# Patient Record
Sex: Male | Born: 1940 | Race: Black or African American | Hispanic: No
Health system: Southern US, Community
[De-identification: ages and names within clinical notes are randomized; demographics above are authoritative.]

---

## 2004-05-19 ENCOUNTER — Ambulatory Visit: Payer: Self-pay | Admitting: Internal Medicine

## 2004-06-09 ENCOUNTER — Ambulatory Visit: Payer: Self-pay | Admitting: Internal Medicine

## 2004-07-02 ENCOUNTER — Ambulatory Visit: Payer: Self-pay | Admitting: Internal Medicine

## 2004-11-05 ENCOUNTER — Ambulatory Visit: Payer: Self-pay | Admitting: Internal Medicine

## 2005-05-03 ENCOUNTER — Ambulatory Visit: Payer: Self-pay | Admitting: Internal Medicine

## 2005-05-04 ENCOUNTER — Ambulatory Visit: Payer: Self-pay | Admitting: Internal Medicine

## 2005-05-12 ENCOUNTER — Ambulatory Visit: Payer: Self-pay | Admitting: Internal Medicine

## 2005-06-09 ENCOUNTER — Ambulatory Visit: Payer: Self-pay | Admitting: Internal Medicine

## 2005-11-24 ENCOUNTER — Ambulatory Visit: Payer: Self-pay | Admitting: Internal Medicine

## 2005-12-10 ENCOUNTER — Ambulatory Visit: Payer: Self-pay | Admitting: Internal Medicine

## 2005-12-20 ENCOUNTER — Ambulatory Visit: Payer: Self-pay | Admitting: Internal Medicine

## 2006-01-09 ENCOUNTER — Ambulatory Visit: Payer: Self-pay | Admitting: Internal Medicine

## 2006-02-09 ENCOUNTER — Ambulatory Visit: Payer: Self-pay | Admitting: Internal Medicine

## 2006-09-29 ENCOUNTER — Ambulatory Visit: Payer: Self-pay | Admitting: Urology

## 2007-07-31 ENCOUNTER — Ambulatory Visit: Payer: Self-pay | Admitting: Internal Medicine

## 2008-09-02 ENCOUNTER — Ambulatory Visit: Payer: Self-pay | Admitting: Vascular Surgery

## 2008-09-03 ENCOUNTER — Ambulatory Visit: Payer: Self-pay | Admitting: Vascular Surgery

## 2008-09-17 ENCOUNTER — Ambulatory Visit: Payer: Self-pay | Admitting: Vascular Surgery

## 2008-09-22 ENCOUNTER — Ambulatory Visit: Payer: Self-pay | Admitting: Vascular Surgery

## 2008-12-19 ENCOUNTER — Emergency Department: Payer: Self-pay | Admitting: Emergency Medicine

## 2008-12-19 ENCOUNTER — Ambulatory Visit: Payer: Self-pay | Admitting: Vascular Surgery

## 2009-03-11 ENCOUNTER — Ambulatory Visit: Payer: Self-pay | Admitting: Radiation Oncology

## 2009-03-31 ENCOUNTER — Ambulatory Visit: Payer: Self-pay | Admitting: Radiation Oncology

## 2009-04-11 ENCOUNTER — Ambulatory Visit: Payer: Self-pay | Admitting: Radiation Oncology

## 2010-09-11 ENCOUNTER — Inpatient Hospital Stay: Payer: Self-pay | Admitting: Internal Medicine

## 2010-09-11 ENCOUNTER — Other Ambulatory Visit: Payer: Self-pay | Admitting: Internal Medicine

## 2010-09-17 LAB — PATHOLOGY REPORT

## 2011-03-07 ENCOUNTER — Ambulatory Visit: Payer: Self-pay

## 2011-03-07 ENCOUNTER — Observation Stay: Payer: Self-pay | Admitting: Internal Medicine

## 2011-06-16 ENCOUNTER — Inpatient Hospital Stay: Payer: Self-pay | Admitting: Internal Medicine

## 2011-06-16 ENCOUNTER — Other Ambulatory Visit: Payer: Self-pay

## 2011-06-16 LAB — COMPREHENSIVE METABOLIC PANEL
Albumin: 3.2 g/dL — ABNORMAL LOW (ref 3.4–5.0)
Alkaline Phosphatase: 54 U/L (ref 50–136)
Anion Gap: 13 (ref 7–16)
Bilirubin,Total: 0.2 mg/dL (ref 0.2–1.0)
Co2: 31 mmol/L (ref 21–32)
Creatinine: 4.32 mg/dL — ABNORMAL HIGH (ref 0.60–1.30)
EGFR (African American): 18 — ABNORMAL LOW
EGFR (Non-African Amer.): 15 — ABNORMAL LOW
Glucose: 223 mg/dL — ABNORMAL HIGH (ref 65–99)
Osmolality: 294 (ref 275–301)
SGOT(AST): 17 U/L (ref 15–37)
SGPT (ALT): 26 U/L
Sodium: 143 mmol/L (ref 136–145)

## 2011-06-16 LAB — CBC
HCT: 21.4 % — ABNORMAL LOW (ref 40.0–52.0)
HGB: 7.1 g/dL — ABNORMAL LOW (ref 13.0–18.0)
MCH: 31.5 pg (ref 26.0–34.0)
MCHC: 32.9 g/dL (ref 32.0–36.0)
MCV: 96 fL (ref 80–100)
Platelet: 248 10*3/uL (ref 150–440)
RBC: 2.24 10*6/uL — ABNORMAL LOW (ref 4.40–5.90)
WBC: 9.6 10*3/uL (ref 3.8–10.6)

## 2011-06-16 LAB — IRON AND TIBC
Iron Bind.Cap.(Total): 271 ug/dL (ref 250–450)
Iron Saturation: 22 %
Iron: 60 ug/dL — ABNORMAL LOW (ref 65–175)

## 2011-06-16 LAB — HEMOGLOBIN: HGB: 6.9 g/dL — ABNORMAL LOW (ref 13.0–18.0)

## 2011-06-16 LAB — FERRITIN: Ferritin (ARMC): 796 ng/mL — ABNORMAL HIGH (ref 8–388)

## 2011-06-17 LAB — URINALYSIS, COMPLETE
Bacteria: NONE SEEN
Glucose,UR: >=500 mg/dL (ref 0–75)
Ketone: NEGATIVE
Leukocyte Esterase: NEGATIVE
Nitrite: NEGATIVE
Protein: 100
RBC,UR: 1 /HPF (ref 0–5)
Specific Gravity: 1.002 (ref 1.003–1.030)
Squamous Epithelial: 1
WBC UR: <1 /HPF (ref 0–5)

## 2011-06-17 LAB — CBC WITH DIFFERENTIAL/PLATELET
Basophil #: 0 10*3/uL (ref 0.0–0.1)
Basophil %: 0.1 %
Eosinophil #: 0.3 10*3/uL (ref 0.0–0.7)
HCT: 27.4 % — ABNORMAL LOW (ref 40.0–52.0)
HGB: 9 g/dL — ABNORMAL LOW (ref 13.0–18.0)
Lymphocyte %: 28.6 %
MCHC: 32.8 g/dL (ref 32.0–36.0)
Monocyte %: 10.6 %
Neutrophil #: 5.1 10*3/uL (ref 1.4–6.5)
Neutrophil %: 57.7 %
Platelet: 195 10*3/uL (ref 150–440)

## 2011-06-17 LAB — BASIC METABOLIC PANEL
Anion Gap: 12 (ref 7–16)
BUN: 26 mg/dL — ABNORMAL HIGH (ref 7–18)
Chloride: 101 mmol/L (ref 98–107)
Co2: 30 mmol/L (ref 21–32)
Creatinine: 6.07 mg/dL — ABNORMAL HIGH (ref 0.60–1.30)
EGFR (Non-African Amer.): 10 — ABNORMAL LOW
Glucose: 131 mg/dL — ABNORMAL HIGH (ref 65–99)
Potassium: 4 mmol/L (ref 3.5–5.1)
Sodium: 143 mmol/L (ref 136–145)

## 2011-06-17 LAB — HEMOGLOBIN A1C: Hemoglobin A1C: 7 % — ABNORMAL HIGH (ref 4.2–6.3)

## 2011-06-18 LAB — PHOSPHORUS: Phosphorus: 3.8 mg/dL (ref 2.5–4.9)

## 2011-06-22 ENCOUNTER — Ambulatory Visit: Payer: Self-pay | Admitting: Gastroenterology

## 2011-08-18 ENCOUNTER — Emergency Department: Payer: Self-pay | Admitting: Emergency Medicine

## 2013-01-02 ENCOUNTER — Encounter: Payer: Self-pay | Admitting: Neurology

## 2013-01-08 ENCOUNTER — Emergency Department: Payer: Self-pay | Admitting: Emergency Medicine

## 2013-01-08 LAB — COMPREHENSIVE METABOLIC PANEL
Albumin: 3.6 g/dL (ref 3.4–5.0)
Alkaline Phosphatase: 97 U/L (ref 50–136)
Anion Gap: 8 (ref 7–16)
BUN: 23 mg/dL — ABNORMAL HIGH (ref 7–18)
Bilirubin,Total: 0.3 mg/dL (ref 0.2–1.0)
Chloride: 100 mmol/L (ref 98–107)
EGFR (African American): 12 — ABNORMAL LOW
EGFR (Non-African Amer.): 11 — ABNORMAL LOW
Glucose: 146 mg/dL — ABNORMAL HIGH (ref 65–99)
Osmolality: 282 (ref 275–301)
SGOT(AST): 16 U/L (ref 15–37)
Sodium: 138 mmol/L (ref 136–145)

## 2013-01-08 LAB — CBC WITH DIFFERENTIAL/PLATELET
Basophil %: 0.5 %
Eosinophil %: 1.4 %
HCT: 32.3 % — ABNORMAL LOW (ref 40.0–52.0)
Lymphocyte #: 1 10*3/uL (ref 1.0–3.6)
Lymphocyte %: 13.3 %
MCH: 30.2 pg (ref 26.0–34.0)
Monocyte #: 0.4 x10 3/mm (ref 0.2–1.0)
Monocyte %: 5.4 %
Neutrophil %: 79.4 %
RBC: 3.52 10*6/uL — ABNORMAL LOW (ref 4.40–5.90)
WBC: 7.7 10*3/uL (ref 3.8–10.6)

## 2013-01-08 LAB — TROPONIN I: Troponin-I: <0.02 ng/mL

## 2013-01-09 ENCOUNTER — Encounter: Payer: Self-pay | Admitting: Neurology

## 2013-02-09 ENCOUNTER — Encounter: Payer: Self-pay | Admitting: Neurology

## 2013-02-13 ENCOUNTER — Ambulatory Visit: Payer: Self-pay | Admitting: Podiatry

## 2013-03-11 ENCOUNTER — Encounter: Payer: Self-pay | Admitting: Neurology

## 2013-06-08 ENCOUNTER — Inpatient Hospital Stay: Payer: Self-pay | Admitting: Internal Medicine

## 2013-06-08 LAB — CBC WITH DIFFERENTIAL/PLATELET
Basophil #: 0.1 10*3/uL (ref 0.0–0.1)
Basophil %: 0.8 %
Eosinophil #: 0.1 10*3/uL (ref 0.0–0.7)
Eosinophil %: 1.5 %
HCT: 22.3 % — AB (ref 40.0–52.0)
HGB: 7.2 g/dL — ABNORMAL LOW (ref 13.0–18.0)
Lymphocyte #: 0.9 10*3/uL — ABNORMAL LOW (ref 1.0–3.6)
Lymphocyte %: 13.2 %
MCH: 27.5 pg (ref 26.0–34.0)
MCHC: 32.4 g/dL (ref 32.0–36.0)
MCV: 85 fL (ref 80–100)
MONO ABS: 0.5 x10 3/mm (ref 0.2–1.0)
MONOS PCT: 7.8 %
NEUTROS ABS: 5.1 10*3/uL (ref 1.4–6.5)
Neutrophil %: 76.7 %
Platelet: 118 10*3/uL — ABNORMAL LOW (ref 150–440)
RBC: 2.64 10*6/uL — AB (ref 4.40–5.90)
RDW: 24.5 % — ABNORMAL HIGH (ref 11.5–14.5)
WBC: 6.6 10*3/uL (ref 3.8–10.6)

## 2013-06-08 LAB — BASIC METABOLIC PANEL
Anion Gap: 5 — ABNORMAL LOW (ref 7–16)
BUN: 17 mg/dL (ref 7–18)
CALCIUM: 8.7 mg/dL (ref 8.5–10.1)
CO2: 30 mmol/L (ref 21–32)
CREATININE: 4.34 mg/dL — AB (ref 0.60–1.30)
Chloride: 98 mmol/L (ref 98–107)
EGFR (African American): 15 — ABNORMAL LOW
GFR CALC NON AF AMER: 13 — AB
GLUCOSE: 123 mg/dL — AB (ref 65–99)
Osmolality: 269 (ref 275–301)
Potassium: 3.5 mmol/L (ref 3.5–5.1)
Sodium: 133 mmol/L — ABNORMAL LOW (ref 136–145)

## 2013-06-08 LAB — TROPONIN I

## 2013-06-08 LAB — HEMOGLOBIN: HGB: 7.9 g/dL — ABNORMAL LOW (ref 13.0–18.0)

## 2013-06-09 LAB — BASIC METABOLIC PANEL
Anion Gap: 4 — ABNORMAL LOW (ref 7–16)
BUN: 24 mg/dL — ABNORMAL HIGH (ref 7–18)
CO2: 31 mmol/L (ref 21–32)
Calcium, Total: 8.5 mg/dL (ref 8.5–10.1)
Chloride: 98 mmol/L (ref 98–107)
Creatinine: 5.88 mg/dL — ABNORMAL HIGH (ref 0.60–1.30)
GFR CALC AF AMER: 10 — AB
GFR CALC NON AF AMER: 9 — AB
GLUCOSE: 108 mg/dL — AB (ref 65–99)
OSMOLALITY: 271 (ref 275–301)
POTASSIUM: 4.4 mmol/L (ref 3.5–5.1)
SODIUM: 133 mmol/L — AB (ref 136–145)

## 2013-06-09 LAB — URINALYSIS, COMPLETE
BACTERIA: NONE SEEN
BLOOD: NEGATIVE
Bilirubin,UR: NEGATIVE
Glucose,UR: 50 mg/dL (ref 0–75)
Ketone: NEGATIVE
Leukocyte Esterase: NEGATIVE
NITRITE: NEGATIVE
Ph: 9 (ref 4.5–8.0)
RBC, UR: NONE SEEN /HPF (ref 0–5)
Specific Gravity: 1.007 (ref 1.003–1.030)
Squamous Epithelial: <1
WBC UR: NONE SEEN /HPF (ref 0–5)

## 2013-06-09 LAB — CBC WITH DIFFERENTIAL/PLATELET
BASOS ABS: 0.1 10*3/uL (ref 0.0–0.1)
BASOS PCT: 0.7 %
EOS ABS: 0.1 10*3/uL (ref 0.0–0.7)
Eosinophil %: 0.9 %
HCT: 25.2 % — ABNORMAL LOW (ref 40.0–52.0)
HGB: 8.3 g/dL — ABNORMAL LOW (ref 13.0–18.0)
LYMPHS ABS: 1.4 10*3/uL (ref 1.0–3.6)
Lymphocyte %: 19.9 %
MCH: 28 pg (ref 26.0–34.0)
MCHC: 32.8 g/dL (ref 32.0–36.0)
MCV: 85 fL (ref 80–100)
MONO ABS: 0.6 x10 3/mm (ref 0.2–1.0)
Monocyte %: 9.2 %
Neutrophil #: 4.8 10*3/uL (ref 1.4–6.5)
Neutrophil %: 69.3 %
PLATELETS: 113 10*3/uL — AB (ref 150–440)
RBC: 2.96 10*6/uL — ABNORMAL LOW (ref 4.40–5.90)
RDW: 23.2 % — AB (ref 11.5–14.5)
WBC: 6.9 10*3/uL (ref 3.8–10.6)

## 2013-06-09 LAB — HEMOGLOBIN A1C: HEMOGLOBIN A1C: 6.7 % — AB (ref 4.2–6.3)

## 2013-06-21 ENCOUNTER — Other Ambulatory Visit: Payer: Self-pay | Admitting: Family Medicine

## 2013-06-22 LAB — URINALYSIS, COMPLETE
Bacteria: NONE SEEN
Bilirubin,UR: NEGATIVE
Glucose,UR: >=500 mg/dL (ref 0–75)
KETONE: NEGATIVE
Nitrite: POSITIVE
Ph: 8 (ref 4.5–8.0)
Protein: 100
SPECIFIC GRAVITY: 1.007 (ref 1.003–1.030)
Squamous Epithelial: 1

## 2013-06-24 LAB — URINE CULTURE

## 2013-08-09 ENCOUNTER — Ambulatory Visit: Payer: Self-pay | Admitting: Nurse Practitioner

## 2014-03-17 IMAGING — CT CT HEAD WITHOUT CONTRAST
1 of 2 series · 13 of 30 positions shown, 17 images · non-contrast
Comparison: none

REASON FOR EXAM: possiblke loc, severe htn, headache
COMMENTS:

PROCEDURE:     CT  - CT HEAD WITHOUT CONTRAST  - January 08, 2013  [DATE]
RESULT:     Technique: Helical noncontrasted 5 mm sections were obtained
from the skull base through the vertex.

[Series 4: soft tissue recon · axial · 0.40mm/px · z∈[-72,+48]mm · 13 of 30 slices shown, 17 images]
[im 3/30  brain]
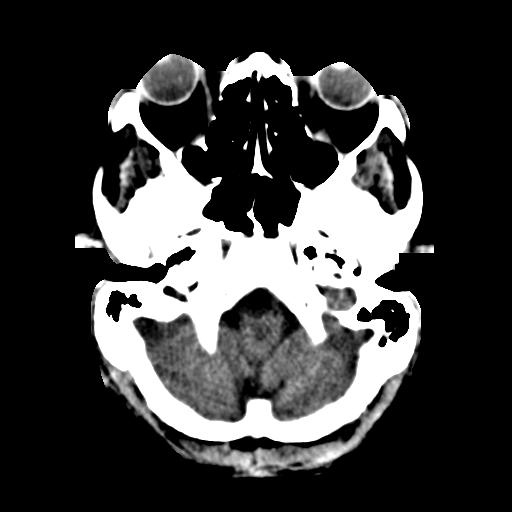
[im 3/30  bone]
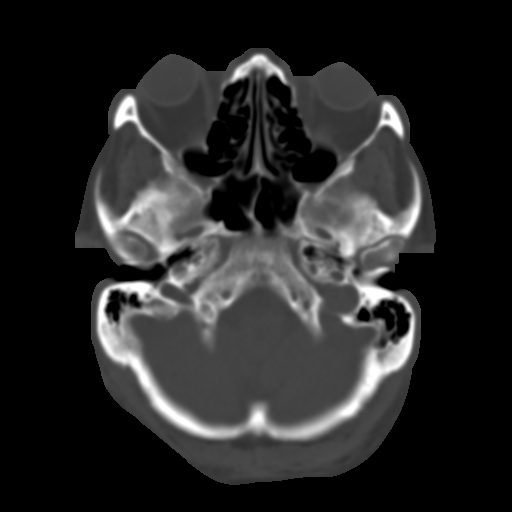
[im 5/30  brain]
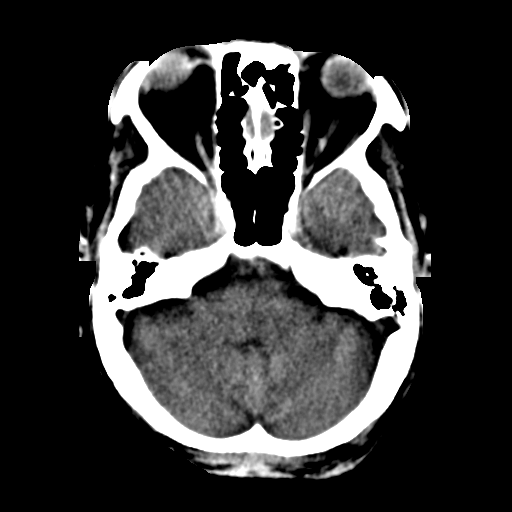
[im 7/30  brain]
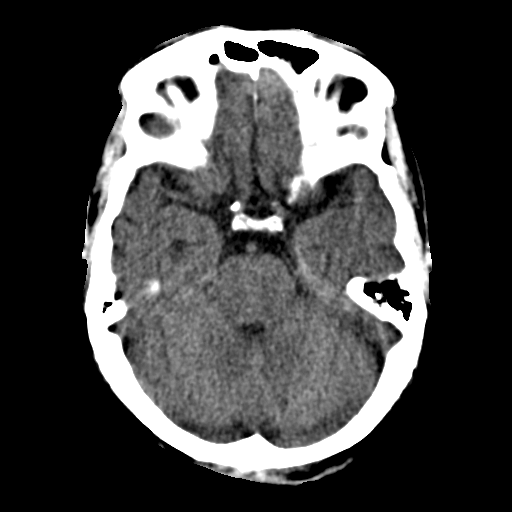
[im 9/30  brain]
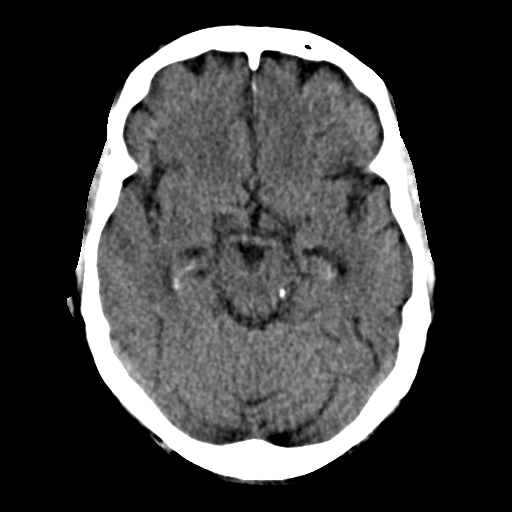
[im 11/30  brain]
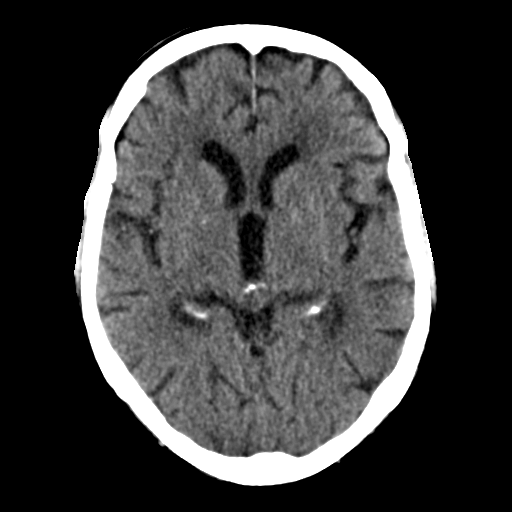
[im 11/30  bone]
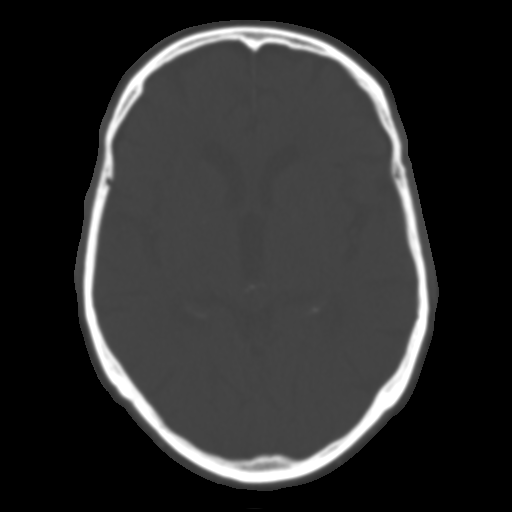
[im 13/30  brain]
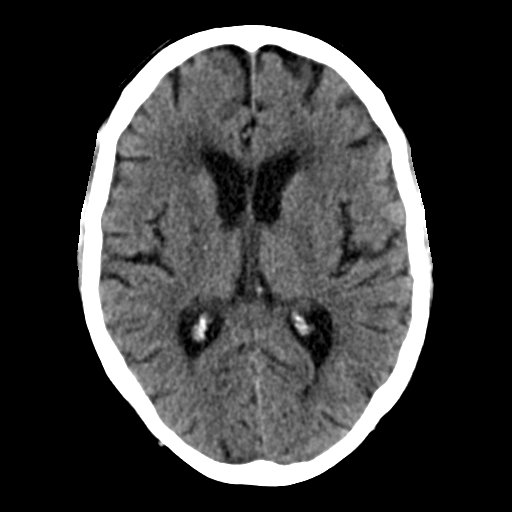
[im 15/30  brain]
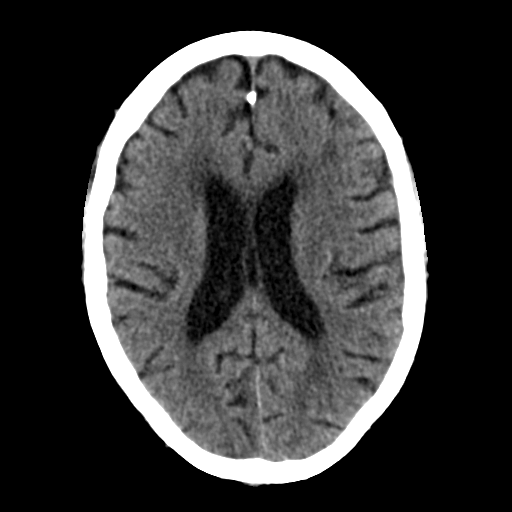
[im 17/30  brain]
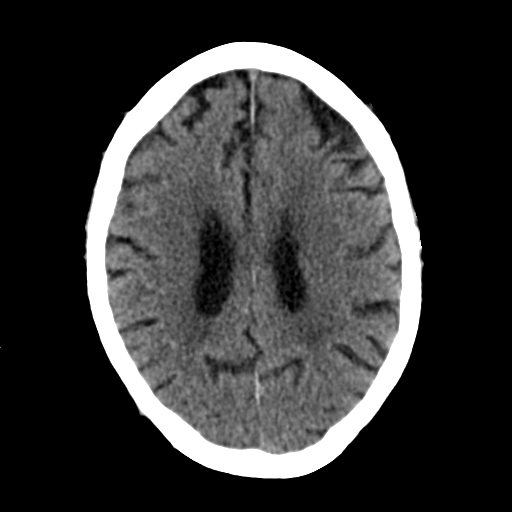
[im 19/30  brain]
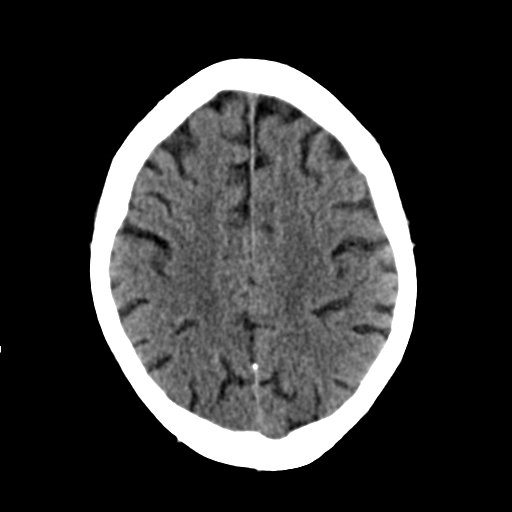
[im 19/30  bone]
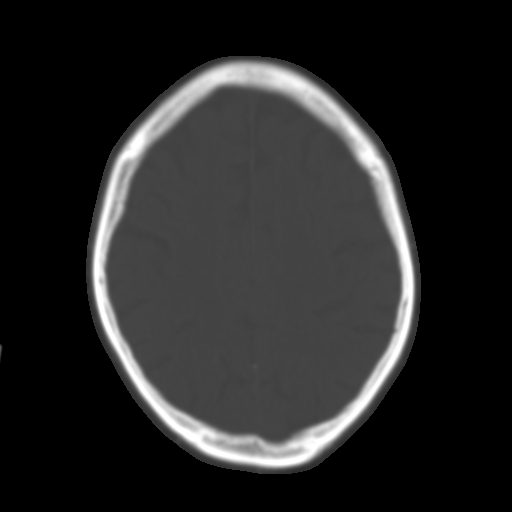
[im 21/30  brain]
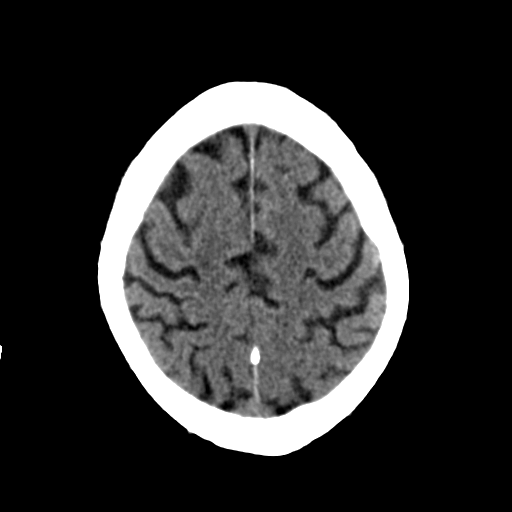
[im 23/30  brain]
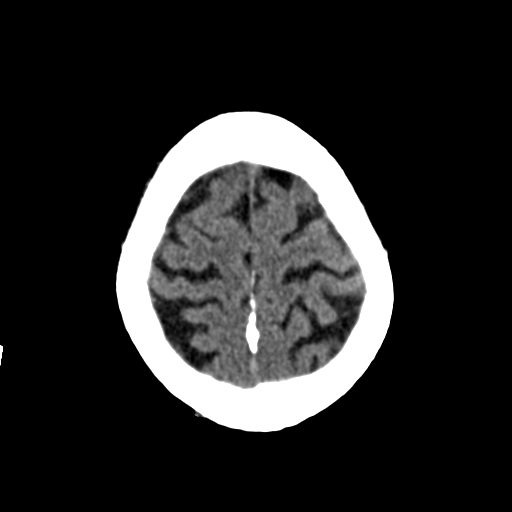
[im 25/30  brain]
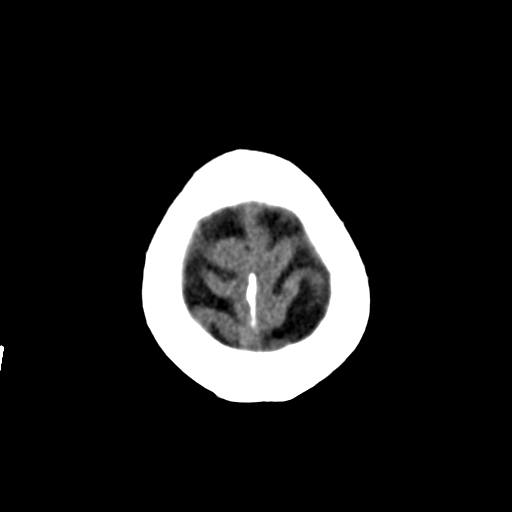
[im 27/30  brain]
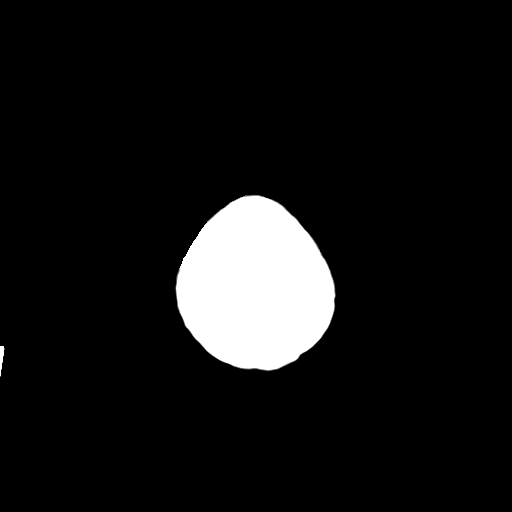
[im 27/30  bone]
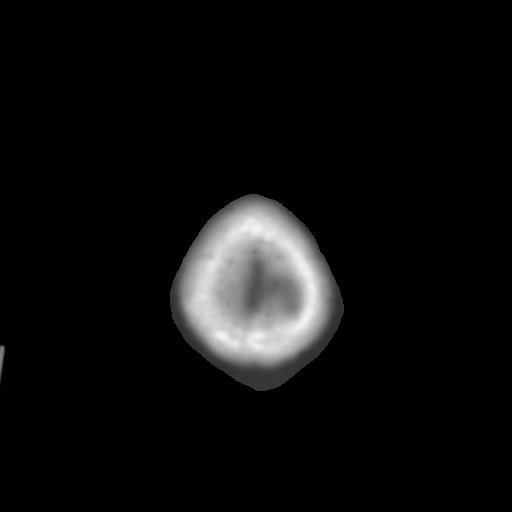

[13 of 30 positions shown; findings below may reference images not displayed]

FINDINGS: Diffuse cortical and cerebellar atrophy is identified as well as
diffuse areas of low attenuation within the subcortical, deep and
periventricular white matter regions. There is not evidence of intra-axial
nor extra-axial fluid collections, acute hemorrhage, mass effect, nor a
depressed skull fracture. The visualized paranasal sinuses and mastoid air
cells are patent.
IMPRESSION: Chronic and involutional changes without evidence of acute
abnormalities. If there is persistent clinical concern further evaluation
with MRI is recommended.
2. Comparison made to prior study dated 08/18/2011

## 2014-08-02 NOTE — H&P (Signed)
PATIENT NAME:  Allen Stevens, Allen Stevens MR#:  [id number] DATE OF BIRTH:  [birth year]  DATE OF ADMISSION:  06/08/2013   PRIMARY CARE PHYSICIAN: Dr. Maryjane HurterFeldpausch.  REFERRING PHYSICIAN: Dr. Carollee MassedKaminski.   CHIEF COMPLAINT: Severe weakness with low hemoglobins.   HISTORY OF PRESENT ILLNESS: This is a very nice 74 year old gentleman who has history of previous GI bleeding with possible AVMs versus petechiae on previous EGD with duodenitis. He also has diabetes, hypertension, end-stage renal disease, history of prostate cancer and Parkinson disease. He comes with a history of getting progressive weakness. Apparently since last Sunday, he started having some difficulty getting up and moving around, prior to that he was in his normal state of health.  Today after dialysis, he was not able to stand up. Whenever he was evaluated in the Emergency Department, his hemoglobin was 7.2 with a previous hemoglobin of 10.6 on his previous visit back in September. Apparently his hemoglobin was being really stable and the family has been telling me that he it has been recently checked within the past 2 months and his hemoglobin was also above 10. The patient denies any significant hematemesis, melena or hematochezia. But whenever I examined the patient in a direct examine myself, I found positive Hemoccult.  There was no significant melena, but his Hemoccult was positive. The patient is admitted for evaluation of possible GI bleeding with acute blood loss anemia. The patient has already received 1 unit of blood ordered by the Emergency physician. Other than that, the patient denies any shortness of breath or chest pain or symptoms. The patient has Parkinson's and he has difficulty communicating. He has very slow and staggered words.  REVIEW OF SYSTEMS:  A 12-system review done.  CONSTITUTIONAL: No fever. Positive fatigue. Positive weakness. No weight loss or weight gain.  EYES: No blurry vision, double vision.  EARS, NOSE, THROAT: No  tinnitus or difficulty swallowing.  RESPIRATORY: No cough, wheezing or COPD. CARDIOVASCULAR: No chest pain or palpitations. No syncope.  GASTROINTESTINAL: No nausea, vomiting, abdominal pain, constipation, melena, hematochezia or jaundice.  GENITOURINARY: No dysuria, hematuria or changes in frequency. The patient has very little amounts of urine due to his end-stage renal disease. HEMATOLOGIC AND LYMPHATIC: No anemia, easy bruising or bleeding.  ENDOCRINE: No polyuria, polydipsia, polyphagia.  SKIN: No rashes, petechiae,or other  lesions.  MUSCULOSKELETAL: Negative for joint pain at this moment or gout.  NEUROLOGIC: Positive tremor and stiffness due to Parkinson's. No CVAs or TIAs. PSYCHIATRIC:  No anxiety or depression.   PAST MEDICAL HISTORY: 1.  Diabetes type 2 insulin-dependent. 2.  Hypertension.  3.  End-stage renal disease.  4.  History of prostate cancer, status post implant seeds. 5.  History of monoclonal gammopathy.  6.  Hyperlipidemia.  7.  Parkinson's. 8.  Erectile dysfunction.  9.  Peptic ulcer disease with duodenitis and GI bleed in the past.   PAST SURGICAL HISTORY: 1.  Left AV fistula placement and fistula repair.  2.  History of implanted prostate seeds.  Denies any other type of surgery.   SOCIAL HISTORY: The patient lives with wife and daughter. He used to smoke the pipe, but has not smoked in more than 5 years. He does not use alcohol or drugs.   FAMILY HISTORY: Negative for MI. Positive for prostate cancer in his brother.   ALLERGIES: No known drug allergies.   MEDICATIONS: Amlodipine 10 mg once a day, vitamin B complex once daily, carbidopa levodopa 25/100 one tablet 3 times a day; he also had extended release,  which is given once a day after dialysis. Cholecalciferol once daily, clonazepam 0.5 mg at bedtime, fluticasone 50 mcg as needed for nasal congestion, furosemide 20 mg 2 times daily, guaifenesin 400 mg 3 times a day as needed, Lantus 70 units once a  day, lisinopril 40 mg once a day, metoprolol 12.5 mg twice daily, Protonix 40 mg once a day, sertraline 25 mg take 2 tablets once a day, Renagel 800 mg take 2 tablets 3 times a day with meals, Flomax 0.4 mg twice daily, trazodone 50 mg once at bedtime.   PHYSICAL EXAMINATION: VITAL SIGNS: Blood pressure of 204/96, pulse 72, respirations 16, temperature 98.8. GENERAL: The patient is alert and oriented x 3, in no acute distress. No respiratory distress. Hemodynamically stable.  HEENT: His pupils are equal and reactive. Extraocular movements are intact. Mucosa moist. Anicteric sclerae. Pink conjunctivae. No oral lesions. No oropharyngeal exudates.  NECK: Supple. No JVD. No thyromegaly. No adenopathy. No carotid bruits. No rigidity.  CARDIOVASCULAR: Regular rate and rhythm. No murmurs, rubs or gallops are appreciated. No displacement of PMI.  LUNGS: Clear without any wheezing or crepitus. No use of accessory muscles.  ABDOMEN: Soft, slightly distended. No tenderness. No rebound. No guarding.  GENITAL: Negative for external lesions. RECTAL:  Prostate seems to be normal size, nontender. He has stool that is yellow-brown, but positive Hemoccult.  EXTREMITIES: No edema, cyanosis or clubbing.  VASCULAR: Pulses +2. Capillary refill less than 3.  NEUROLOGIC: Cranial nerves II through XII intact. Mild rigidity on cogwheeling and intentional tremor. The patient also has normal sensation. No focal findings.  PSYCHIATRIC: No significant agitation. The patient is alert, oriented x 3. Very slow speech due to his Parkinson's with very low tone of voice. LYMPHATIC: Negative for lymphadenopathy in neck or supraclavicular areas.  MUSCULOSKELETAL: No joint effusions or swelling.  DIAGNOSTIC DATA:  Glucose is 123, BUN 17, creatinine 4.34. Sodium 134, troponin 0.02. White count 6.6, hemoglobin 7.2 with a previous hemoglobin of 10.6 and platelet count 118.   EKG: No ST depression or elevation. Positive LVH and left  atrial enlargement.   ASSESSMENT AND PLAN: A 74 year old gentleman with history of diabetes, hypertension, end-stage renal disease presented with weakness. This happened within a week progressively. Positive guaiac and hemoglobin of 7 with a previous hemoglobin around 10. The patient is admitted for possible gastrointestinal bleeding.  1.  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  The patient is not actively bleeding. I think this is mostly slow bleeding. The patient is a difficult historian due to his Parkinson. He states that he does not look much at his stool up until today they were normal brown. Rectal exam shows positive guaiac. The patient is admitted with a Protonix drip and transfusion of 1 unit of packed red blood cells. Gastroenterology is going to be consulted for possible procedure versus just symptomatic treatment. The patient is hemodynamically stable right now.  2.  Gastrointestinal bleeding. The patient on Protonix. Positive guaiac, likely this is coming from a previous problem arteriovenous malformation and duodenitis. His diet is going to be clear liquid diet for now. The patient is going to be admitted with hemoglobin check after transfusion and hemoglobin check in the morning.  3.  Malignant hypertension. Blood pressure in the 200s systolic. We are going to add on labetalol and hydralazine for now.  4.  End-stage renal disease. Continue dialysis  on Tuesday, Thursday, Saturday. We consult renal on the case if the patient is here for dialysis on that day. 5.  Diabetes. Keep checking Accu-Cheks. The patient is going to be nothing by mouth, at risk of hypoglycemia.  6.  Hyperlipidemia. Continue use of statin. 7.  Gastrointestinal prophylaxis with Protonix drip. 8.  Deep vein thrombosis prophylaxis with mechanical compression devices as the patient  is not able to take blood thinners since he is having gastrointestinal bleeding.   TIME SPENT: I spent about 60 minutes with this patient today.    ____________________________ Felipa Furnace, MD rsg:ce D: 06/08/2013 18:36:20 ET T: 06/08/2013 19:13:10 ET JOB#: [id number]  cc: Felipa Furnace, MD, <Dictator> Dannetta Lekas Juanda Chance MD ELECTRONICALLY SIGNED 06/11/2013 13:12

## 2014-08-02 NOTE — Consult Note (Signed)
PATIENT NAME:  Allen Stevens, Allen Stevens MR#:  [id number] DATE OF BIRTH:  05/10/1940  DATE OF ADMISSION:  06/08/2013 DATE OF CONSULTATION:  06/10/2013  CONSULTING PHYSICIAN:  Ezzard Standing. Adelis Docter, MD  REASON FOR REFERRAL: Weakness and low hemoglobin.   HISTORY OF PRESENT ILLNESS: The patient is a 74 year old black gentleman with a known history of GI bleeding in the past with possible AVM or duodenitis on the previous upper endoscopies. These were done in 2012 and 2013. He also has a history of diabetes, hypertension, end-stage renal disease and history of Parkinson disease. The patient comes in with progressively worsening weakness. He was also having some trouble getting up and moving around. After dialysis on the day of admission, he was not able to get up.  He was then brought in to the Emergency Room where his hemoglobin was only 7.2. His previous hemoglobin has been 10.6 back in September. When I went by, the patient is rather sleepy and lethargic. I was not able to get much history at all. Apparently, he denied having any hematemesis, gross hematochezia or melena at home when he talked to the admitting doctor. He did have heme-positive stool on admission though. As a result of the significant anemia and the increasing weakness, the patient was brought in for further evaluation. The patient did get a unit of blood upon admission.   REVIEW OF SYMPTOMS: Again, I could not get any history from the patient. Please refer to the initial review of symptoms that was dictated by the admitting doctor.   PAST MEDICAL HISTORY: Includes hypertension, end-stage renal disease. He also has a history of prostate cancer requiring implant seeds. Other history includes monoclonal gammopathy, Parkinson disease, hyperlipidemia and hypertension.   PAST SURGICAL HISTORY: AV fistula placement and prostate implants.   SOCIAL HISTORY: There is no smoking or alcohol.   FAMILY HISTORY: Notable for prostate cancer.   There are no known drug  allergies.   MEDICATIONS AT HOME: Include a combination of carbidopa and levodopa. He is also on amlodipine 10 mg daily, Lantus, clonazepam at bedtime, fluticasone for nasal congestion, Lasix 20 mg twice a day, insulin daily, lisinopril 40 mg daily, metoprolol 12.5 mg twice a day, Protonix once daily, sertraline, Renagel, Flomax and trazodone.   PHYSICAL EXAMINATION: GENERAL: Initial blood pressure is quite high at 204/96. The rest of the vital signs were stable.  HEAD AND NECK: Within normal limits.  CARDIAC: Revealed regular rhythm and rate.  LUNGS: Clear bilaterally.  ABDOMEN: Showed normoactive bowel sounds, soft, slightly distended, but he had active bowel sounds. There is no rebound or guarding.  RECTAL: Examination by the admitting doctor showed heme-positive stool.  EXTREMITIES: Show no clubbing, cyanosis or edema.  NEUROLOGIC: The patient is pretty lethargic right now.  SKIN: Negative.   LABORATORY DATA: Sodium 133, potassium 4.4. Creatinine is 5.88, BUN 24. Initial white count was 7.2; he went up to 8.3 after blood transfusion. Urinalysis was negative.   This is a patient with chronic anemia with end-stage renal disease whose hemoglobin dropped to 7.2 with heme-positive stool. He does have a history of either duodenal arteriovenous malformation or duodenitis on previous endoscopies. Even though the patient is on Protonix, I have to be concerned about bleeding from the site. We will schedule an upper endoscopy tomorrow if the patient is more alert.   Thank you for the referral.    ____________________________ Ezzard Standing. Bluford Kaufmann, MD pyo:dmm D: 06/10/2013 12:11:44 ET T: 06/10/2013 12:29:51 ET JOB#: [id number]  cc: Ezzard Standing. Arriel Victor,  MD, <Dictator> Ezzard StandingPAUL Y [facility] ELECTRONICALLY SIGNED 06/10/2013 19:05

## 2014-08-02 NOTE — Discharge Summary (Signed)
PATIENT NAME:  Allen Stevens, Allen Stevens MR#:  [id number] DATE OF BIRTH:  1940-05-21  DATE OF ADMISSION:  06/08/2013 DATE OF DISCHARGE:  06/10/2013  PRESENTING COMPLAINT:  Weakness with low hemoglobin.   PRIMARY CARE PHYSICIAN:  Dr. Maryjane HurterFeldpausch.  DISCHARGE DIAGNOSES: 1.  Acute on chronic anemia due to duodenitis.  2.  Hypertension.  3.  Parkinson's disease.  4.  Type 2 diabetes.   PROCEDURES:  Esophagogastroduodenoscopy showed duodenitis.  No active bleeding.  Upper GI endoscopy showed hiatus hernia, erythematous mucosa in the gastric body, normal esophagus.  Erythematous duodenopathy.  CODE STATUS:  FULL CODE.   MEDICATIONS: 1.  Amlodipine 10 mg daily.  2.  Lasix 20 mg twice daily.  3.  Vitamin D3 1000 international units daily.  4.  Guaifenesin 400 mg 1 tablet 2 to 3 times a day as needed.  5.  Lantus 7 units daily.  6.  Lisinopril 40 mg daily.  7.  Sertraline 25 mg 2 tablets daily.  8.  Carbidopa levodopa 25/100 1 tablet 4 times a day.  9.  Sinemet CR 25/500 1 tablet every day in the morning on nondialysis days; 1 tablet after dialysis on dialysis days.  10.  Protonix 40 mg twice daily.  11.  Sevelamer 800 mg 2 tablets twice daily.   FOLLOWUP:   1.  With Dr. Maryjane HurterFeldpausch in 1 to 2 weeks.  2.  Resume hemodialysis as before.   CONSULTATIONS:  GI consultation with Dr. Bluford Kaufmannh.   LABORATORY DATA:  Hemoglobin at discharge was 8.3.  Hemoglobin A1c is 6.7.  The patient received 1 unit of blood transfusion.   BRIEF SUMMARY OF HOSPITAL COURSE:  Allen Stevens is a 74 year old African American gentleman with history of Parkinson's, diabetes, hypertension, end-stage renal disease, on hemodialysis, presents with positive guaiac stools and hemoglobin of 7.2.  His previous hemoglobin was around 10.  He was admitted with:  1.  Acute on chronic anemia with weakness, likely due to slow GI bleed.  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  He was not actively bleeding.  He was started on  Protonix drip, seen by Dr. Bluford Kaufmannh.  His hemoglobin was down to 7.2, baseline is around 10, went up to 8.3 after 1 unit blood transfusion.  The patient underwent EGD, results as above were noted.  PPI twice daily was continued.  2.  Parkinson's disease.  Per wife, the patient is doing okay at home.  He sees Dr. Malvin JohnsPotter.  His Parkinson's meds were continued.  3.  Malignant hypertension, on labetalol and hydralazine.  Blood pressure was stable.  4.  End stage renal disease.  Continue dialysis in-house.  His days are Tuesdays, Thursdays, Saturdays.  5.  Type 2 diabetes.  Sliding scale insulin and by mouth diabetic meds were resumed at discharge.  6.  Hyperlipidemia, statins were used.   Hospital stay otherwise remained stable.  THE PATIENT REMAINED A FULL CODE.   TIME SPENT:  40 minutes.    ____________________________ Wylie HailSona A. Allena KatzPatel, MD sap:ea D: 06/11/2013 17:09:59 ET T: 06/12/2013 06:09:32 ET JOB#: [id number]  cc: Sharren Schnurr A. Allena KatzPatel, MD, <Dictator> Marina Goodellale E. Feldpausch, MD Ezzard StandingPaul Y. Bluford Kaufmannh, MD Paulita FujitaZachary E. Malvin JohnsPotter, MD   Willow OraSONA A Rhia Blatchford MD ELECTRONICALLY SIGNED 06/18/2013 15:26

## 2014-08-02 NOTE — Consult Note (Signed)
Pt more alert today. EGD showed hiatal hernia and duodenitis. No acute bleeding. Can have solids. Will sign off. If patient rebleeds actively, call us back. Otherwise, have patient f/u in GI office. With hx of multiple colon polyps, consider repeating colonoscopy this year. Thanks.  Electronic Signatures: Lutricia Feilh, Deanta Mincey (MD)  (Signed on 02-Mar-15 13:51)  Authored  Last Updated: 02-Mar-15 13:51 by Lutricia Feilh, Dorethy Tomey (MD)

## 2014-08-02 NOTE — Consult Note (Signed)
Pt seen and examined. Full consult to follow. Pt lethargic now. Unable to get much history. Chronic anemia with recent drop in hgb. Heme positive stool. Had mutiple adenomatous/hyperplastic polyps in 2012. 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 Likely UGI bleed. Will plan EGD tomorrow if patient more alert. Thanks.   Electronic Signatures: Lutricia Feilh, Hazleigh Mccleave (MD) (Signed on 01-Mar-15 12:16)  Authored   Last Updated: 01-Mar-15 12:16 by Lutricia Feilh, Raquell Richer (MD)

## 2014-08-03 NOTE — Consult Note (Signed)
Chief Complaint:   Subjective/Chief Complaint Please see full GI consult.  Patient seen and examined, chart reviewed.  Patient admitted with black stools and anemia, in the setting of nsaid use.  Currently on ppi and has been transfused.  Will proceed with egd.  I have discussed the risks benefits and complication so of egd to include not limited to bleeding infection perforation and sedation and he wishes to proceed.   Further recs to follow.   VITAL SIGNS/ANCILLARY NOTES: **Vital Signs.:   08-Mar-13 11:52   Vital Signs Type Routine   Temperature Temperature (F) 98   Celsius 36.6   Temperature Source oral   Pulse Pulse 64   Pulse source per Dinamap   Respirations Respirations 18   Systolic BP Systolic BP 793   Diastolic BP (mmHg) Diastolic BP (mmHg) 79   Mean BP 104   BP Source Dinamap   Pulse Ox % Pulse Ox % 96   Pulse Ox Activity Level  At rest   Oxygen Delivery Room Air/ 21 %   Brief Assessment:   Cardiac Regular    Respiratory clear BS    Gastrointestinal details normal Soft  Nontender  Nondistended  No masses palpable  Bowel sounds normal  No rebound tenderness   Routine Hem:  07-Mar-13 17:37    Hemoglobin (CBC) 7.1  Routine Chem:  07-Mar-13 17:37    Iron, Serum 60   Iron Saturation 22   Ferritin ([facility]) 796  Routine UA:  08-Mar-13 04:44    Color (UA) Straw   Clarity (UA) Clear   Glucose (UA) >=500   Bilirubin (UA) Negative   Ketones (UA) Negative   Specific Gravity (UA) 1.002   Blood (UA) Negative   pH (UA) 9.0   Nitrite (UA) Negative   Leukocyte Esterase (UA) Negative   RBC (UA) 1 /HPF   WBC (UA) <1 /HPF   Epithelial Cells (UA) 1 /HPF  Routine Hem:  08-Mar-13 05:10    WBC (CBC) 8.9   RBC (CBC) 2.90   Hemoglobin (CBC) 9.0   Hematocrit (CBC) 27.4   Platelet Count (CBC) 195   MCV 95   MCH 31.1   MCHC 32.8   RDW 17.4  Routine Chem:  08-Mar-13 05:10    Glucose, Serum 131   BUN 26   Creatinine (comp) 6.07   Sodium, Serum 143   Potassium, Serum  4.0   Chloride, Serum 101   CO2, Serum 30   Calcium (Total), Serum 8.3   Osmolality (calc) 292   eGFR (African American) 12   eGFR (Non-African American) 10   Anion Gap 12  Routine Hem:  08-Mar-13 05:10    Neutrophil % 57.7   Lymphocyte % 28.6   Monocyte % 10.6   Eosinophil % 3.0   Basophil % 0.1   Neutrophil # 5.1   Lymphocyte # 2.6   Monocyte # 0.9   Eosinophil # 0.3   Basophil # 0.0  Routine Chem:  08-Mar-13 05:10    Hemoglobin A1c ([facility]) 7.0  Blood Glucose:  08-Mar-13 07:43    POCT Blood Glucose 129    11:51    POCT Blood Glucose 140    16:38    POCT Blood Glucose 101   Electronic Signatures: Loistine Simas (MD)  (Signed 08-Mar-13 17:08)  Authored: Chief Complaint, VITAL SIGNS/ANCILLARY NOTES, Brief Assessment, Lab Results   Last Updated: 08-Mar-13 17:08 by Loistine Simas (MD)

## 2014-08-03 NOTE — Discharge Summary (Signed)
PATIENT NAME:  Allen Stevens, Allen Stevens MR#:  [id number] DATE OF BIRTH:  Mar 14, 1941  DATE OF ADMISSION:  06/16/2011 DATE OF DISCHARGE:  06/18/2011  PRESENTING COMPLAINT: Low hemoglobin.   DISCHARGE DIAGNOSES:  1. Gastrointestinal bleed due to duodenitis from NSAID use.  2. End-stage renal disease, on hemodialysis.   PROCEDURE: EGD showed duodenitis.   CONDITION ON DISCHARGE: Fair.   MEDICATIONS:  1. Protonix 40 mg p.o. b.i.d. for three weeks, then daily.  2. Renal Tab Zinc 1 tablet daily.  3. Crestor 10 mg daily.  4. Amlodipine 10 mg daily.  5. Lasix 20 mg daily.  6. Tamsulosin 0.4 mg daily.  7. Renvela 800 mg 3 times a day.  8. Rena-Vite 1 tablet daily.  9. Lisinopril 20 mg daily.  10. Humalog Mix 50/50 25 units daily.  11. Humalog Mix 50/50 15 units sub-Q at bedtime.  12. Clonazepam 0.5 mg p.o. daily at bedtime.  13. Fosrenol 1000 mg p.o. 3 times a day.  14. Calcitonin 200 international unit injectable solution as needed.   FOLLOW-UP: 1. Follow-up with Dr. Marva PandaSkulskie in three weeks.  2. Modified barium swallow to be done on March 13th in the Radiology department.   CONSULTATIONS: 1. Nephrology consultation with Dr. Cherylann RatelLateef  2. GI consultation with Dr. Marva PandaSkulskie   BRIEF SUMMARY OF HOSPITAL COURSE: Mr. [name] is a 74 year old African American gentleman with history of end-stage renal disease on hemodialysis who presented with: 1. Acute on chronic anemia suspect from slow GI bleed from duodenitis due to NSAID use. The patient's hemoglobin on admission was 6.9. He is status post 2 unit blood transfusion with hemoglobin of 8.7. He underwent EGD by Dr. Marva PandaSkulskie which showed duodenitis. He was continued on p.o. PPI and recommended continued for three weeks and then change to daily dosing of PPI and follow-up with GI as outpatient.  2. Type II diabetes. His home meds were resumed.  3. Hypertension. Resumed home meds.  4. End-stage renal disease. The patient was continued on his routine dialysis in  the hospital.  5. History of monoclonal paraproteinemia, followed by Dr. Sherrlyn HockPandit.   Hospital stay otherwise remained stable.   CODE STATUS: The patient remained a FULL CODE.   TIME SPENT: 40 minutes.   ____________________________ Wylie HailSona A. Allena KatzPatel, MD sap:drc D: 06/24/2011 15:29:58 ET T: 06/24/2011 16:09:28 ET JOB#: [id number]  cc: Malosi Hemstreet A. Allena KatzPatel, MD, <Dictator> Christena DeemMartin U. Skulskie, MD Munsoor Lizabeth LeydenN. Lateef, MD Willow OraSONA A Shela Esses MD ELECTRONICALLY SIGNED 06/26/2011 13:39

## 2014-08-03 NOTE — H&P (Signed)
PATIENT NAME:  Allen PrimusVANS, Md MR#:  [id number] DATE OF BIRTH:  18-Aug-1940  DATE OF ADMISSION:  06/16/2011  REFERRING PHYSICIAN: Malachy Moanevainder Goli, MD    CHIEF COMPLAINT: Referred from [facility] for low blood count.   HISTORY OF PRESENT ILLNESS: The patient is a 74 year old male with history of end-stage renal disease, on dialysis Tuesday, Thursday, Saturday, diabetes, hypertension, history of guaiac-positive stool, who has had EGD and colonoscopy in June of 2012 with EGD showing duodenitis with possible tiny AVMs versus petechiae, and lower gastrointestinal bleed showing diverticulosis, multiple polyps and internal hemorrhoids, who is here after being referred from [facility] Center for having low blood count. Initial hemoglobin was 6.9. The patient states that he has had dark tarry stools and was told he was heme positive six weeks ago. He was told at that time his blood count was 9, which I assume is hemoglobin. He denies having any abdominal pain, nausea or vomiting. He has had no bright red blood per rectum. Per ER physician, he had dark tarry stools on rectal exam as well as guaiac-positive stools here. He is hemodynamically stable. He has been on ibuprofen every other day almost for a couple of weeks for some arthritis. He denies taking that prior to that. Hospitalist Services were contacted for further evaluation and management. The patient denies dizziness, chest pains or shortness of breath.   PAST MEDICAL HISTORY:  1. Diabetes.  2. Hypertension. 3. End-stage renal disease, on dialysis, last dialysis was today.  4. History of prostate cancer with implanted seeds. 5. History of monoclonal paraproteinemia, followed by Dr. Sherrlyn HockPandit. 6. Erectile dysfunction.  7. Hyperlipidemia.   PAST SURGICAL HISTORY:  1. Left AV fistula. 2. History of implanted seeds in the prostate.  3. AV fistula repair.   ALLERGIES: None.  MEDICATIONS:  1. Aspirin 81 mg daily.  2. Ibuprofen 800 mg p.r.n. which he has  taken over-the-counter for pain. 3. Lasix 20 mg daily.  4. Humalog 50-50, 25 units in the morning, and 15 units at bedtime. 5. Lisinopril 20 mg daily.  6. Rena-Vite 1 tab daily. 7. Tamsulosin 0.4 milligrams daily.  8. Renvela 800 mg t.i.d.  9. Amlodipine 10 mg daily.   SOCIAL HISTORY: He lives with his wife and daughter. Occasional tobacco. No drugs. Occasional alcohol.   FAMILY HISTORY: Positive for diabetes.    REVIEW OF SYSTEMS: CONSTITUTIONAL: No fever or fatigue, just overall weak. No weight changes. EYES: Denies blurry vision or double vision. ENT: No tinnitus, hearing loss, or snoring. RESPIRATORY: No cough, wheezing. Occasional shortness of breath. No hemoptysis. CARDIOVASCULAR: Denies chest pains. Occasionally has lower extremity edema. No arrhythmia. No dyspnea on exertion, palpitations. Has a history of syncope. GASTROINTESTINAL: No nausea, vomiting, diarrhea, or abdominal pain. No hematemesis. Positive for dark tarry stools. History of polyps. GENITOURINARY: Denies dysuria or hematuria or frequency. ENDOCRINE: Denies polyuria, nocturia, or thyroid problems. HEME/LYMPH: Does have history of anemia of chronic disease. No acute bleeding. SKIN: Denies rashes. MUSCULOSKELETAL: Has chronic arthritis and chronic back pain. NEUROLOGIC: Denies numbness or focal weakness or dementia. PSYCHIATRIC: Denies anxiety or insomnia.   PHYSICAL EXAMINATION:  VITAL SIGNS: Temperature 98.1, pulse 92, respiratory rate 18, blood pressure 146/72, oxygen saturation 96% on room air.   GENERAL: The patient is a pleasant African American male, awake, alert and oriented x3. No obvious distress. Lying in bed and talking in full sentences.   HEENT: Normocephalic, atraumatic. Pupils are equal and reactive. Moist mucous membranes.   NECK: Supple. No thyroid tenderness. No  JVD.  LUNGS: Clear to auscultation. No wheezing or crackles.   CARDIOVASCULAR: S1, S2, regular rate and rhythm. No murmurs or rubs appreciated.    ABDOMEN: Soft, nontender, nondistended. Positive hyperactive bowel sounds in all quadrants. No organomegaly appreciated.   EXTREMITIES: No significant lower extremity edema.   NEUROLOGICAL: Cranial nerves II through XII are grossly intact. Strength five out of five in all extremities.   LABORATORY, DIAGNOSTIC AND RADIOLOGICAL DATA:  Creatinine 4.32, BUN 19, glucose 223, sodium 143, potassium 3.9.  LFTs: Albumin is 3.2, otherwise within normal limits.  Troponin negative x1.  Hemoglobin in the morning was 6.9 at the Dialysis Center, this afternoon it is 7.1. Hematocrit 21.4. Of note, hemoglobin was 9.9 on November 27th, and hematocrit was 30.7 at that time. Platelets today are 248. MCV is 96.  EKG: Normal sinus rhythm with sinus arrhythmia, left axis deviation, rate 72, Q waves in V1 to V3, T wave inversions in V5 and V6 and flattening of the T waves in II and  aVF.   ASSESSMENT AND PLAN: We have a 74 year old African American male with history of end-stage renal disease on dialysis, hypertension, diabetes, history of heme positive stools in the past who has undergone EGD and colonoscopy in the middle of last year, who presents with black tarry stools for several weeks, recent ibuprofen use, history of multiple polyps, internal hemorrhoids and also tiny AVMs versus petechiae and duodenitis from EGD in the middle of last year, presents with significant anemia. Anemia could be upper or lower GI. At this point, he is not tachycardic. Hemodynamically he is stable. He is not acutely bleeding. I would admit the patient for further evaluation. I would hold ibuprofen as well as aspirin for now. I would frequently check his hemoglobin. He is to get 2 units of PRBC per ER physician. We will check iron studies and obtain a GI consult. PPI will be ordered b.i.d.   In regards to his diabetes, I would check a hemoglobin A1c, and cut his insulin by one-half and start sliding scale insulin. I would also cut his  blood pressure medications by half for now and start hydralazine p.r.n.   I would also consult with Nephrology to resume dialysis. He is not volume overloaded currently and does not need urgent dialysis. He has had dialysis today.  I would start the patient on SCDs and his other outpatient medications.   CODE STATUS:  The patient is a FULL CODE.     TOTAL TIME SPENT:  50 minutes. ____________________________ Krystal Eaton, MD sa:cbb D: 06/16/2011 19:47:04 ET T: 06/16/2011 22:01:24 ET JOB#: [id number]  cc: Krystal Eaton, MD, <Dictator> Marina Goodell, MD Krystal Eaton MD ELECTRONICALLY SIGNED 07/01/2011 18:40

## 2014-08-03 NOTE — Consult Note (Signed)
PATIENT NAME:  Allen Stevens, Allen Stevens MR#:  [id number] DATE OF BIRTH:  Jul 09, 1940  DATE OF CONSULTATION:  06/17/2011  REFERRING PHYSICIAN:   CONSULTING PHYSICIAN:  Keturah Barre, NP  HISTORY OF PRESENT ILLNESS: Mr. Cabiness is a pleasant 74 year old African American male with history of end-stage renal disease on dialysis Tuesday, Thursday, and Saturday, diabetes, hypertension, history of prostate cancer with implanted seeds and monoclonal paraproteinemia, hyperlipidemia and erectile dysfunction who was admitted yesterday with a decrease in hemoglobin showing to be 6.9. Please see full history and physical done on admission for details. GI has been consulted at the request of Dr. Jacques Navy for heme-positive stool and anemia. Patient today reports a six week history of black tarry stools and dysphagia. States that he has been taking ibuprofen 800 mg p.o. p.r.n. and Advil PM to help him sleep. He has been taking pantoprazole at home as well. He denies nausea, vomiting, diarrhea, abdominal pain, changes in appetite and all other gastrointestinal complaints today. Has had EGD and colonoscopy June of last year. 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 Colonoscopy with four adenomatous polyps without high-grade dysplasia or malignancy. Diverticula and some hyperplastic polyps. Noted in chart that his hemoglobin last autumn was running around 10.   PAST MEDICAL HISTORY: 1. Diabetes. 2. Hypertension. 3. End-stage renal disease on dialysis as noted, last was yesterday.  4. History of prostate cancer with implanted seeds. 5. History of monoclonal paraproteinemia followed by Dr. Sherrlyn Hock. 6. Erectile dysfunction.  7. Hyperlipidemia.   PAST SURGICAL HISTORY:  1. Left AV fistula. 2. History of implanted seeds in the prostate. 3. Fistula repair.   ALLERGIES: None.    MEDICATIONS:  1. Aspirin 81 mg p.o. daily.  2. Ibuprofen 800 mg p.o. p.r.n.  3. Lasix 20 mg p.o. daily.  4. Humalog 50-50,  25 units q.a.m., 15 units q.p.m.  5. Lisinopril 20 mg p.o. daily. + 6. Tamsulosin 0.4 mg p.o. daily.  7. Renvela 800 mg p.o. t.i.d. with meals. 8. Amlodipine 10 mg p.o. daily.  9. Pantoprazole 40 mg p.o. daily.  10. Clonazepam 0.5 mg p.o. daily p.r.n.  11. Fosrenol 1000 mg p.o. daily.  12. Crestor 10 mg p.o. daily.  13. Calcitonin 3 times weekly in dialysis.   SOCIAL HISTORY: Lives with his family. Occasional tobacco. No illicits. Rare EtOH.   FAMILY HISTORY: Significant for diabetes. Negative for colorectal cancer, liver disease, ulcers.   REVIEW OF SYSTEMS: CONSTITUTIONAL: Feels somewhat weak after dialysis. Denies unusual fatigue and fevers. States his weight is stable. HEENT: No visual changes, tinnitus, hearing loss. RESPIRATORY: Denies cough, wheeze. Does feel occasionally short of breath at times usually prior to dialysis. No hemoptysis. CARDIOVASCULAR: No chest pain. Occasional ankle edema. No arrhythmia or activity changes. Has had syncopal episode in the past. GASTROINTESTINAL: As noted. GENITOURINARY: No dysuria, hematuria. Does report that he still does make some urine, small amounts frequently at times. ENDOCRINE: Significant for diabetes. No history of thyroid problems. HEME: Does have history of anemia of chronic disease. No easy bleeding or bruising. SKIN: No erythema, lesion or rash. Does have fistula to the left arm. MUSCULOSKELETAL: Chronic arthritis with back pain. No recent changes. NEUROLOGIC: No history of numbness, weakness or dementia. No history of stroke. PSYCHIATRIC: No depression or anxiety.   LABORATORY, DIAGNOSTIC AND RADIOLOGICAL DATA: Most recent lab work 03/08: Serum 131, BUN 26, creatinine 6.07, sodium 143, potassium 4, chloride 101, GFR 10, WBC 8.9, hemoglobin 9, hematocrit 27.4, platelet count 195, MCV 95, MCH 31.1,  MCHC 32.8, RDW 17.4. Lab work from 03/07 demonstrated ferritin level 796, total serum protein 6.9, albumin 3.2, total bilirubin 0.2, alkaline  phosphatase 54, AST 17, ALT 26, troponin 0.02. Urinalysis done on 03/08 was negative for blood, negative for urinary tract infection.   PHYSICAL EXAMINATION: VITAL SIGNS: Most recent vital signs: Temperature 98, pulse 64, respiratory rate 18, blood pressure 154/79.   GENERAL: Pleasant African American male in bed, appears comfortable in no acute distress.   PSYCH: Mood stable. Pleasant, cooperative, logical train of thought, memory good.   HEENT: Normocephalic, atraumatic. Mucous membranes pink and moist. Sclerae anicteric. No redness, drainage, or inflammation from the eyes or nares.   NECK: Supple. No thyromegaly, lymphadenopathy or JVD.   RESPIRATORY: Respirations eupneic. Lungs CTAB.   CARDIOVASCULAR: S1, S2, regular rate and rhythm. No MRG. No edema. Peripheral pulses 2+.   ABDOMEN: Soft, active BS x4. Nontender, nondistended. No hepatosplenomegaly, hernias, peritoneal signs or rebound tenderness.   RECTUM: Small external hemorrhoid. Stool dark brown, heme positive.   EXTREMITIES: MAEW x4. No clubbing, cyanosis, or edema. Fistula is patent to left upper extremity.   SKIN: Warm, dry, pink. No erythema, lesion or rash.   NEUROLOGICAL: Alert and oriented x3. Cranial nerves II through XII grossly intact. Sensation intact.   ASSESSMENT: 1. History of NSAID. 2. Use drop in hemoglobin. 3. Heme-positive stool.    PLAN: Agree with IV pantoprazole and transfusion. Will plan for upper endoscopy this afternoon. Patient is n.p.o. and will remain so until after procedure.   These services were provided by Vevelyn Pathristiane Eluterio Seymour, MSN, NPC in collaboration with Christena DeemMartin U. Skulskie, M.D.   ____________________________ Keturah Barrehristiane H. Sussan Meter, NP chl:cms D: 06/17/2011 14:35:30 ET T: 06/17/2011 15:01:25 ET JOB#: [id number]  cc: Keturah Barrehristiane H. Eliakim Tendler, NP, <Dictator>  Eustaquio MaizeHRISTIANE H Merlinda Wrubel FNP ELECTRONICALLY SIGNED 06/18/2011 7:17

## 2014-08-03 NOTE — Consult Note (Signed)
Brief Consult Note: Diagnosis: anemia.   Patient was seen by consultant.   Consult note dictated.   Recommend to proceed with surgery or procedure.   Discussed with Attending MD.   Comments: Appreciate consult for 74 y/o [place] man with history of ESRD, dialysis T, Th, Sa; admitted for drop in hgb following dialysis down to 6.9. Noted transfusion and hgb increase following. Today he reports a 6 wk history of black tarry stools and dysphagia. States he has been taking Ibuprofen 800mg  po prn, and Advil pm to help him sleep.  Has been taking Pantoprazole at home. Denies NVD, abdominal pain, changes in appetite and all other GI complaints. Has had EGD and colonscopy last summer: EGD with hiatal hernia, duodenitis and AVMs. Colonscopy with 4 adenomatous polyps without high grade dysplasia/malignancy, diverticula and some hyperplastic polyps.  Hemoglobin last autumn was around 10. Impression and Plan: Heme positive stool with drop in hemoglobin and NSAID use. Agree with bid IV PPI, transfusion. Patient states he has not had anything to eat or drink today. Will plan for EGD this afternoon. Risk and benefits discussed.  Electronic Signatures: Vevelyn PatLondon, Liannah Yarbough H (NP)  (Signed 08-Mar-13 14:40)  Authored: Brief Consult Note   Last Updated: 08-Mar-13 14:40 by Keturah BarreLondon, Elyanna Wallick H (NP)

## 2015-05-13 DEATH — deceased
